# Patient Record
Sex: Female | Born: 1995 | Race: White | Hispanic: No | Marital: Single | Smoking: Never smoker
Health system: Southern US, Community
[De-identification: ages and names within clinical notes are randomized; demographics above are authoritative.]

## PROBLEM LIST (undated history)

## (undated) DIAGNOSIS — J45909 Unspecified asthma, uncomplicated: Secondary | ICD-10-CM

---
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
## 2014-03-30 HISTORY — PX: ANTERIOR CRUCIATE LIGAMENT REPAIR: SHX115

## 2018-01-01 ENCOUNTER — Emergency Department (INDEPENDENT_AMBULATORY_CARE_PROVIDER_SITE_OTHER): Payer: 59

## 2018-01-01 ENCOUNTER — Emergency Department
Admission: EM | Admit: 2018-01-01 | Discharge: 2018-01-01 | Disposition: A | Discharge status: Home / Self Care | Payer: 59 | Source: Home / Self Care | Attending: Family Medicine | Admitting: Family Medicine

## 2018-01-01 ENCOUNTER — Other Ambulatory Visit: Payer: Self-pay

## 2018-01-01 DIAGNOSIS — M25571 Pain in right ankle and joints of right foot: Secondary | ICD-10-CM

## 2018-01-01 DIAGNOSIS — S8011XA Contusion of right lower leg, initial encounter: Secondary | ICD-10-CM

## 2018-01-01 NOTE — Discharge Instructions (Signed)
°  You may try the ankle splint and alternating cool and warm compresses at home for pain relief. Please follow up with Sports Medicine this week if not improving. Additional imaging such as an MRI may be indicated to rule out a stress fracture if pain continues.

## 2018-01-01 NOTE — ED Triage Notes (Signed)
Here with right foot ankle pain radiating to lower leg. Injury occurred 2 weeks ago while playing soccer. Sharp, shooting pain with ambulation or certain movements. Tried ice, compression. Knot noted to right lower leg.

## 2018-01-01 NOTE — ED Provider Notes (Signed)
[facility]    CSN: [id number] Arrival date & time: 01/01/18  1116     History   Chief Complaint Chief Complaint  Patient presents with  . Foot Injury    HPI Stacy Martin is a 22 y.o. female.   HPI Stacy Martin is a 22 y.o. female presenting to UC with c/o 2 weeks of persistent, gradually worsening Right foot and ankle pain that started after injuring her ankle playing soccer.  She has tried ice, compression and ibuprofen w/o much relief.  Pain is sharp and shooting at times.  Worse with ambulation. She has since noticed bruising and a knot to her lower leg after her most recent game but does not recall an injury during the most recent game. Hx of stress fracture in the past but she does not recall if it was her Left or Right ankle.    History reviewed. No pertinent past medical history.  There are no active problems to display for this patient.   History reviewed. No pertinent surgical history.  OB History   None      Home Medications    Prior to Admission medications   Not on File    Family History History reviewed. No pertinent family history.  Social History Social History   Tobacco Use  . Smoking status: Not on file  Substance Use Topics  . Alcohol use: Not on file  . Drug use: Not on file     Allergies   Other   Review of Systems Review of Systems  Musculoskeletal: Positive for arthralgias, joint swelling and myalgias.  Skin: Positive for color change. Negative for wound.  Neurological: Negative for weakness and numbness.     Physical Exam Triage Vital Signs ED Triage Vitals  Enc Vitals Group     BP 01/01/18 1132 129/89     Pulse Rate 01/01/18 1132 72     Resp --      Temp 01/01/18 1132 (!) 97.4 F (36.3 C)     Temp Source 01/01/18 1132 Oral     SpO2 01/01/18 1132 100 %     Weight 01/01/18 1134 156 lb 12.8 oz (71.1 kg)     Height --      Head Circumference --      Peak Flow --      Pain Score 01/01/18 1133 5   Pain Loc --      Pain Edu? --      Excl. in GC? --    No data found.  Updated Vital Signs BP 129/89 (BP Location: Right Arm)   Pulse 72   Temp (!) 97.4 F (36.3 C) (Oral)   Wt 156 lb 12.8 oz (71.1 kg)   LMP 12/11/2017   SpO2 100%   Visual Acuity Right Eye Distance:   Left Eye Distance:   Bilateral Distance:    Right Eye Near:   Left Eye Near:    Bilateral Near:     Physical Exam  Constitutional: She is oriented to person, place, and time. She appears well-developed and well-nourished.  HENT:  Head: Normocephalic and atraumatic.  Eyes: EOM are normal.  Neck: Normal range of motion.  Cardiovascular: Normal rate.  Pulses:      Dorsalis pedis pulses are 2+ on the right side.       Posterior tibial pulses are 2+ on the right side.  Pulmonary/Chest: Effort normal.  Musculoskeletal: Normal range of motion. She exhibits tenderness. She exhibits no edema.  Legs: Right lower leg: tenderness over bruise. 1cm tenderness nodule in center of ecchymosis, anterior/lateral lower leg.   Mild tenderness to anterior aspect of ankle and dorsal aspect of foot over 1st and 2nd metatarsals   Neurological: She is alert and oriented to person, place, and time.  Skin: Skin is warm and dry. Capillary refill takes less than 2 seconds. Ecchymosis noted.  Psychiatric: She has a normal mood and affect. Her behavior is normal.  Nursing note and vitals reviewed.    UC Treatments / Results  Labs (all labs ordered are listed, but only abnormal results are displayed) Labs Reviewed - No data to display  EKG None  Radiology Dg Ankle Complete Right  Result Date: 01/01/2018 CLINICAL DATA:  Right foot and ankle soccer injury with pain and bruising EXAM: RIGHT ANKLE - COMPLETE 3+ VIEW COMPARISON:  None. FINDINGS: There is no evidence of fracture, dislocation, or joint effusion. There is no evidence of arthropathy or other focal bone abnormality. Soft tissues are unremarkable. IMPRESSION:  Negative. Electronically Signed   By: Delbert Phenix M.D.   On: 01/01/2018 11:59   Dg Foot Complete Right  Result Date: 01/01/2018 CLINICAL DATA:  Recent soccer injury to the right foot, with pain and bruising EXAM: RIGHT FOOT COMPLETE - 3+ VIEW COMPARISON:  None. FINDINGS: There is no evidence of fracture or dislocation. There is no evidence of arthropathy or other focal bone abnormality. Soft tissues are unremarkable. IMPRESSION: Negative. Electronically Signed   By: Delbert Phenix M.D.   On: 01/01/2018 12:01    Procedures Procedures (including critical care time)  Medications Ordered in UC Medications - No data to display  Initial Impression / Assessment and Plan / UC Course  I have reviewed the triage vital signs and the nursing notes.  Pertinent labs & imaging results that were available during my care of the patient were reviewed by me and considered in my medical decision making (see chart for details).     No abnormality noted on imaging Will tx as sprain ASO splint provided for comfort Home care instructions provided.  Final Clinical Impressions(s) / UC Diagnoses   Final diagnoses:  Pain in joint involving right ankle and foot  Contusion of right lower leg, initial encounter     Discharge Instructions      You may try the ankle splint and alternating cool and warm compresses at home for pain relief. Please follow up with Sports Medicine this week if not improving. Additional imaging such as an MRI may be indicated to rule out a stress fracture if pain continues.     ED Prescriptions    None     Controlled Substance Prescriptions [place] Controlled Substance Registry consulted? Not Applicable   Teeghan, Hammer, PA-C 01/01/18 1325

## 2018-05-14 ENCOUNTER — Other Ambulatory Visit: Payer: Self-pay

## 2018-05-14 ENCOUNTER — Emergency Department (INDEPENDENT_AMBULATORY_CARE_PROVIDER_SITE_OTHER)
Admission: EM | Admit: 2018-05-14 | Discharge: 2018-05-14 | Disposition: A | Discharge status: Home / Self Care | Payer: 59 | Source: Home / Self Care

## 2018-05-14 DIAGNOSIS — M545 Low back pain, unspecified: Secondary | ICD-10-CM

## 2018-05-14 LAB — POCT URINALYSIS DIP (MANUAL ENTRY)
BILIRUBIN UA: NEGATIVE
BILIRUBIN UA: NEGATIVE mg/dL
Blood, UA: NEGATIVE
Glucose, UA: NEGATIVE mg/dL
Leukocytes, UA: NEGATIVE
Nitrite, UA: NEGATIVE
PROTEIN UA: NEGATIVE mg/dL
SPEC GRAV UA: 1.025 (ref 1.010–1.025)
Urobilinogen, UA: 0.2 E.U./dL
pH, UA: 5.5 (ref 5.0–8.0)

## 2018-05-14 NOTE — Discharge Instructions (Signed)
Take ibuprofen 200 mg 3 to 4 pills 3 times daily as needed for pain and/your acetaminophen (Tylenol) 500 mg 2 pills 3 times daily as needed for pain  Can replace the ibuprofen with Aleve (Naprosyn) 220 mg 2 pills 3 times daily if desired.  However do not take both Aleve and ibuprofen.  Apply heat as needed  Avoid straining.  Return if not improving.  No additional testing indicated today, but if you get worse you might need some other studies done.

## 2018-05-14 NOTE — ED Triage Notes (Signed)
Pt c/o lower RT back pain since Monday. Was really bad Mon-Wed. Pain is intermittent. Mentions mother has a hereditary type kidney stone history.

## 2018-05-14 NOTE — ED Provider Notes (Signed)
[facility]    CSN: [id number] Arrival date & time: 05/14/18  1447     History   Chief Complaint Chief Complaint  Patient presents with  . Back Pain    Lower RT    HPI Stacy Martin is a 23 y.o. female.   HPI  Patient developed right low back pain on Monday.  She says the pain is been between a 6 and an 8.  Knows of no injury and did nothing traumatic.  She did have a Pap smear that morning but no procedures with it.  Her menstrual cycle is not due for another week.  She is on oral contraceptives.  Not on any other regular medicines.  Has never had back pains like this in the past.  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  History reviewed. No pertinent past medical history.  There are no active problems to display for this patient.   History reviewed. No pertinent surgical history.  OB History   No obstetric history on file.      Home Medications    Prior to Admission medications   Not on File    Family History History reviewed. No pertinent family history.  Social History Social History   Tobacco Use  . Smoking status: Never Smoker  . Smokeless tobacco: Never Used  Substance Use Topics  . Alcohol use: Not on file  . Drug use: Not on file     Allergies   Other   Review of Systems Review of Systems Unremarkable.  No dysuria.  Physical Exam Triage Vital Signs ED Triage Vitals  Enc Vitals Group     BP 05/14/18 1526 (!) 132/91     Pulse Rate 05/14/18 1526 80     Resp --      Temp 05/14/18 1526 98 F (36.7 C)     Temp Source 05/14/18 1526 Oral     SpO2 05/14/18 1526 99 %     Weight 05/14/18 1534 157 lb (71.2 kg)     Height 05/14/18 1534 5\' 5"  (1.651 m)     Head Circumference --      Peak Flow --      Pain Score 05/14/18 1534 0     Pain Loc --      Pain Edu? --      Excl. in GC? --    No data found.  Updated Vital Signs BP (!) 132/91 (BP Location: Right Arm)   Pulse 80    Temp 98 F (36.7 C) (Oral)   Ht 5\' 5"  (1.651 m)   Wt 71.2 kg   LMP  (LMP Unknown)   SpO2 99%   BMI 26.13 kg/m   Visual Acuity Right Eye Distance:   Left Eye Distance:   Bilateral Distance:    Right Eye Near:   Left Eye Near:    Bilateral Near:     Physical Exam Abdomen soft.  Straight leg raising negative.  No CVA tenderness.  Mild tenderness right over the medial aspect of the right iliac area.  This is lateral to the SI joint.  No tenderness down in the hip area.  Flexion normal.  On raising up from the flexed back position she had some pain when she is starting to extend it.  Side to side and rotational no pain.  UC Treatments / Results  Labs (all labs ordered are listed, but only abnormal results are displayed) Labs Reviewed -  No data to display  EKG None  Radiology No results found.  Procedures Procedures (including critical care time)  Medications Ordered in UC Medications - No data to display  Initial Impression / Assessment and Plan / UC Course  I have reviewed the triage vital signs and the nursing notes.  Pertinent labs & imaging results that were available during my care of the patient were reviewed by me and considered in my medical decision making (see chart for details).     Mechanical low back pain, a little atypical.  See instructions. Final Clinical Impressions(s) / UC Diagnoses   Final diagnoses:  None   Discharge Instructions   None    ED Prescriptions    None     Controlled Substance Prescriptions [place] Controlled Substance Registry consulted? No   Peyton Najjar, MD 05/14/18 [phone]

## 2020-01-30 IMAGING — DX DG FOOT COMPLETE 3+V*R*
3 series · 3 of 3 positions shown · non-contrast
Comparison: None.

CLINICAL DATA: Recent soccer injury to the right foot, with pain
and bruising

EXAM:
RIGHT FOOT COMPLETE - 3+ VIEW

[foot ap]
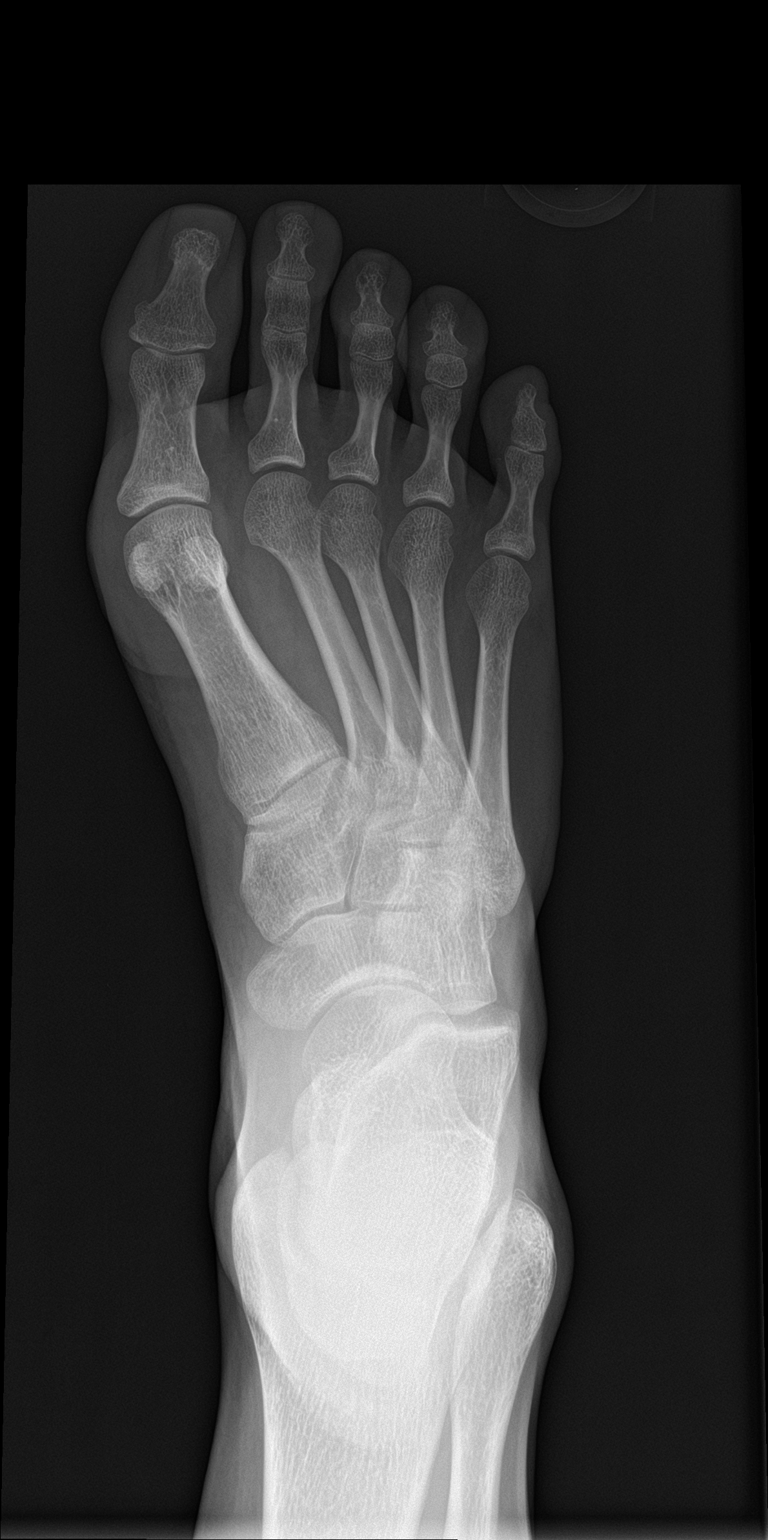

[foot obl]
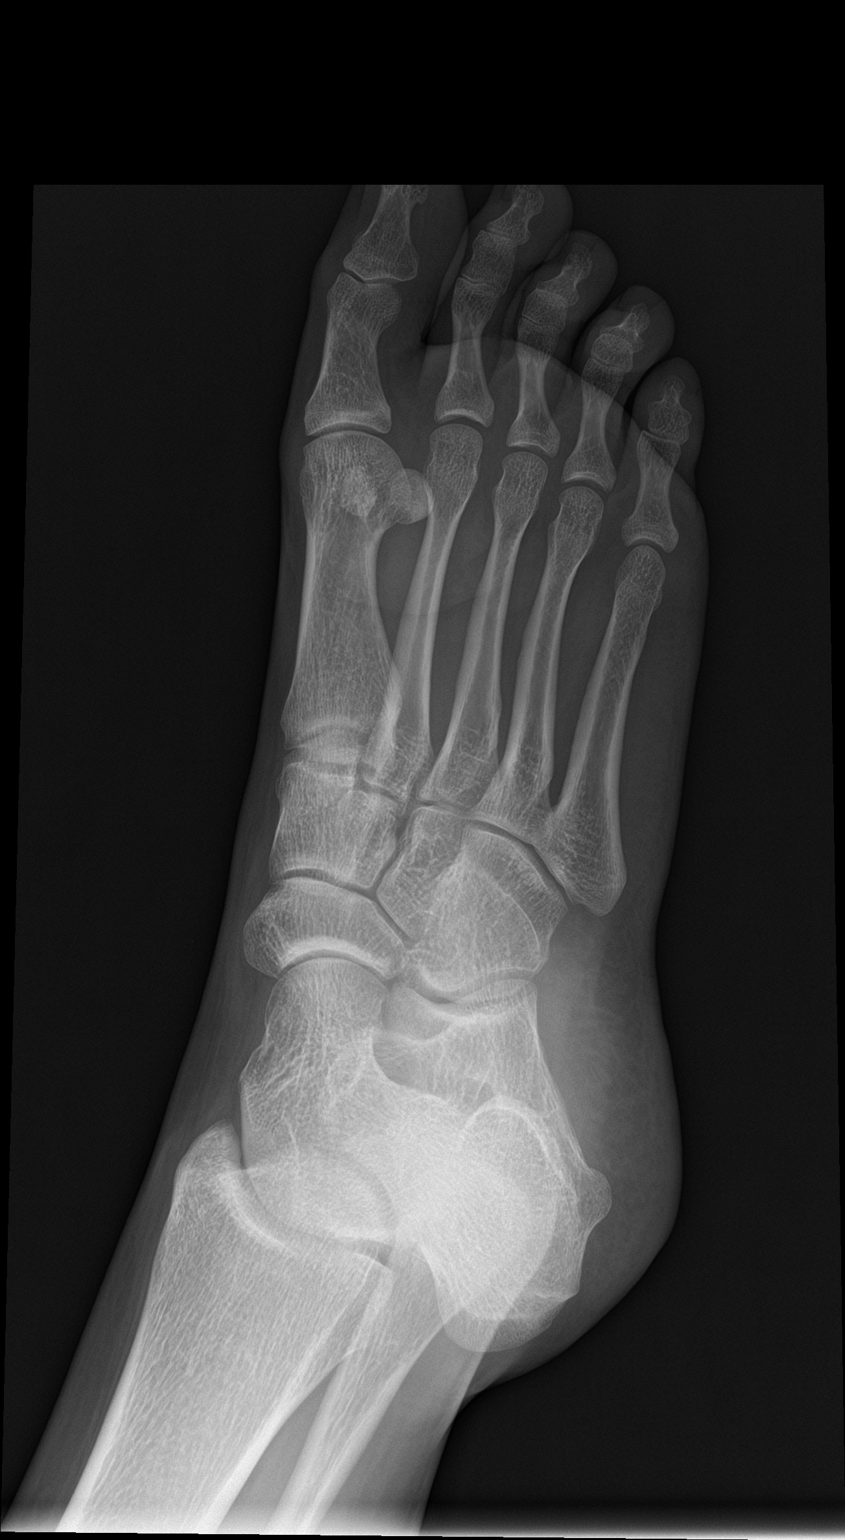

[foot lat]
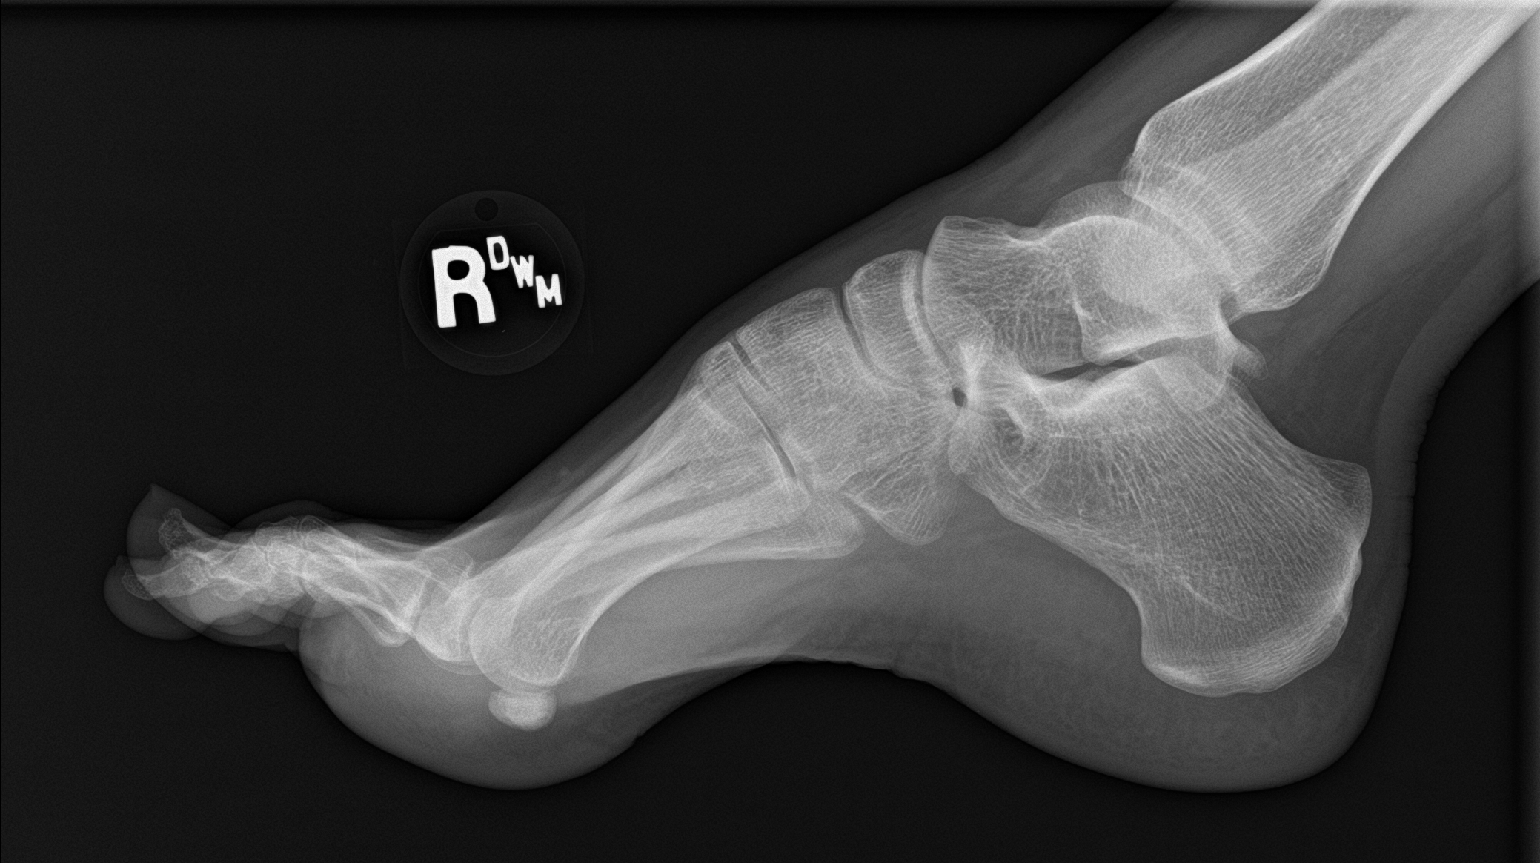

[3 of 3 positions shown; findings below may reference images not displayed]

FINDINGS: There is no evidence of fracture or dislocation. There is no
evidence of arthropathy or other focal bone abnormality. Soft
tissues are unremarkable.
IMPRESSION: Negative.

## 2021-07-01 ENCOUNTER — Emergency Department
Admission: EM | Admit: 2021-07-01 | Discharge: 2021-07-01 | Disposition: A | Discharge status: Home / Self Care | Payer: Managed Care, Other (non HMO) | Source: Home / Self Care

## 2021-07-01 ENCOUNTER — Encounter: Payer: Self-pay | Admitting: Emergency Medicine

## 2021-07-01 DIAGNOSIS — H109 Unspecified conjunctivitis: Secondary | ICD-10-CM

## 2021-07-01 DIAGNOSIS — H02844 Edema of left upper eyelid: Secondary | ICD-10-CM

## 2021-07-01 HISTORY — DX: Unspecified asthma, uncomplicated: J45.909

## 2021-07-01 MED ORDER — PREDNISONE 20 MG PO TABS: ORAL_TABLET | ORAL | 0 refills | Status: AC

## 2021-07-01 MED ORDER — MOXIFLOXACIN HCL 0.5 % OP SOLN: 1.0000 [drp] | Freq: Three times a day (TID) | OPHTHALMIC | 0 refills | Status: AC

## 2021-07-01 NOTE — ED Provider Notes (Signed)
?[facility] ? ? ? ?CSN: [id number] ?Arrival date & time: 07/01/21  1551 ? ? ?  ? ?History   ?Chief Complaint ?Chief Complaint  ?Patient presents with  ? Eye Pain  ?  Left eye  ? ? ?HPI ?Stacy Martin is a 26 y.o. female.  ? ?HPI 26 year old female presents with left eye pain since this morning.  Patient reports flushed with water but pain worse now.  Reports left upper eyelid is swollen with redness.  Patient is concerned that she may have scratched her eye. ? ?Past Medical History:  ?Diagnosis Date  ? Asthma   ? ? ?There are no problems to display for this patient. ? ? ?Past Surgical History:  ?Procedure Laterality Date  ? ANTERIOR CRUCIATE LIGAMENT REPAIR  2016  ? w/ meniscus repair  ? ? ?OB History   ?No obstetric history on file. ?  ? ? ? ?Home Medications   ? ?Prior to Admission medications   ?Medication Sig Start Date End Date Taking? Authorizing Provider  ?albuterol (VENTOLIN HFA) 108 (90 Base) MCG/ACT inhaler Inhale into the lungs. 10/31/14  Yes [provider]  ?moxifloxacin (VIGAMOX) 0.5 % ophthalmic solution Place 1 drop into the left eye 3 (three) times daily for 7 days. 07/01/21 07/08/21 Yes Trevor Iha, FNP  ?predniSONE (DELTASONE) 20 MG tablet Take 3 tabs PO daily x 5 days. 07/01/21  Yes Trevor Iha, FNP  ?ISIBLOOM 0.15-30 MG-MCG tablet Take 1 tablet by mouth daily. 06/22/21   [provider]  ? ? ?Family History ?Family History  ?Problem Relation Age of Onset  ? Diabetes Mother   ? Healthy Father   ? ? ?Social History ?Social History  ? ?Tobacco Use  ? Smoking status: Never  ? Smokeless tobacco: Never  ?Vaping Use  ? Vaping Use: Never used  ?Substance Use Topics  ? Alcohol use: Yes  ?  Alcohol/week: 4.0 standard drinks  ?  Types: 4 Standard drinks or equivalent per week  ? Drug use: Never  ? ? ? ?Allergies   ?Other ? ? ?Review of Systems ?Review of Systems  ?Eyes:  Positive for pain.  ?All other systems reviewed and are negative. ? ? ?Physical Exam ?Triage Vital Signs ?ED  Triage Vitals  ?Enc Vitals Group  ?   BP 07/01/21 1630 137/89  ?   Pulse Rate 07/01/21 1630 82  ?   Resp 07/01/21 1630 15  ?   Temp 07/01/21 1630 98.7 ?F (37.1 ?C)  ?   Temp Source 07/01/21 1630 Oral  ?   SpO2 07/01/21 1630 100 %  ?   Weight 07/01/21 1632 185 lb (83.9 kg)  ?   Height 07/01/21 1632 5\' 6"  (1.676 m)  ?   Head Circumference --   ?   Peak Flow --   ?   Pain Score 07/01/21 1632 5  ?   Pain Loc --   ?   Pain Edu? --   ?   Excl. in GC? --   ? ?No data found. ? ?Updated Vital Signs ?BP 137/89 (BP Location: Right Arm)   Pulse 82   Temp 98.7 ?F (37.1 ?C) (Oral)   Resp 15   Ht 5\' 6"  (1.676 m)   Wt 185 lb (83.9 kg)   LMP 06/17/2021 (Approximate)   SpO2 100%   BMI 29.86 kg/m?  ? ?Visual Acuity ?Right Eye Distance: 20/25 ?Left Eye Distance: 20/30 ?Bilateral Distance: 20/20 ? ?   ? ?Physical Exam ?Vitals and nursing  note reviewed.  ?Constitutional:   ?   Appearance: Normal appearance. She is normal weight.  ?HENT:  ?   Head: Normocephalic and atraumatic.  ?   Mouth/Throat:  ?   Mouth: Mucous membranes are moist.  ?   Pharynx: Oropharynx is clear.  ?Eyes:  ?   Extraocular Movements: Extraocular movements intact.  ?   Conjunctiva/sclera: Conjunctivae normal.  ?   Pupils: Pupils are equal, round, and reactive to light.  ?   Comments: Left eye: Sclera with +2 injection, mucopurulent discharge noted at inner canthus, stye irrigated with normal saline and inspected, no foreign body noted, 4 drops of tetracaine hydrochloride stilled into left eye after several minutes left upper eyelid inverted and inspected, no foreign body noted.  BioGlo fluorescein sodium ophthalmic strip used with a black light to check for corneal abrasion, no corneal abrasion noted.  ?Cardiovascular:  ?   Rate and Rhythm: Normal rate and regular rhythm.  ?   Pulses: Normal pulses.  ?   Heart sounds: Normal heart sounds.  ?Pulmonary:  ?   Effort: Pulmonary effort is normal.  ?   Breath sounds: Normal breath sounds.  ?Musculoskeletal:  ?    Cervical back: Normal range of motion and neck supple.  ?Skin: ?   General: Skin is warm and dry.  ?Neurological:  ?   General: No focal deficit present.  ?   Mental Status: She is alert and oriented to person, place, and time.  ? ? ? ?UC Treatments / Results  ?Labs ?(all labs ordered are listed, but only abnormal results are displayed) ?Labs Reviewed - No data to display ? ?EKG ? ? ?Radiology ?No results found. ? ?Procedures ?Procedures (including critical care time) ? ?Medications Ordered in UC ?Medications - No data to display ? ?Initial Impression / Assessment and Plan / UC Course  ?I have reviewed the triage vital signs and the nursing notes. ? ?Pertinent labs & imaging results that were available during my care of the patient were reviewed by me and considered in my medical decision making (see chart for details). ? ?  ? ?MDM: 1. Conjunctivitis of left eye, unspecified conjunctivitis type-Rx'd Vigamox; 2.  Swelling of left upper eyelid-Rx'd Prednisone. Advised patient to take medication as directed with food to completion.  Advised patient to instill eyedrops as directed.  Advised patient if right eye develops similar symptoms may treat right eye with Vigamox as well.  Advised patient if symptoms worsen and/or unresolved please follow-up with local optometry/ophthalmology for further evaluation.  Work note provided to patient prior to discharge.  Patient discharged home, hemodynamically stable. ?Final Clinical Impressions(s) / UC Diagnoses  ? ?Final diagnoses:  ?Conjunctivitis of left eye, unspecified conjunctivitis type  ?Swelling of left upper eyelid  ? ? ? ?Discharge Instructions   ? ?  ?Advised patient to take medication as directed with food to completion.  Advised patient to instill eyedrops as directed.  Advised patient if right eye develops similar symptoms may treat right eye with Vigamox as well.  Advised patient if symptoms worsen and/or unresolved please follow-up with local optometry/ophthalmology  for further evaluation. ? ? ? ? ?ED Prescriptions   ? ? Medication Sig Dispense Auth. Provider  ? predniSONE (DELTASONE) 20 MG tablet Take 3 tabs PO daily x 5 days. 15 tablet Trevor Ihaagan, Melisha Eggleton, FNP  ? moxifloxacin (VIGAMOX) 0.5 % ophthalmic solution Place 1 drop into the left eye 3 (three) times daily for 7 days. 3 mL Trevor Ihaagan, Clarece Drzewiecki, FNP  ? ?  ? ?  PDMP not reviewed this encounter. ?  ?Trevor Iha, FNP ?07/01/21 1740 ? ?

## 2021-07-01 NOTE — Discharge Instructions (Addendum)
Advised patient to take medication as directed with food to completion.  Advised patient to instill eyedrops as directed.  Advised patient if right eye develops similar symptoms may treat right eye with Vigamox as well.  Advised patient if symptoms worsen and/or unresolved please follow-up with local optometry/ophthalmology for further evaluation. ?

## 2021-07-01 NOTE — ED Triage Notes (Signed)
Left eye pain this am  ?Flushed w/ water  ?Pain worse now  ?Eye lid swollen - w/ redness ?Concerned she may have scratched the eye   ?No other pain meds or eye gtts ?
